# Patient Record
Sex: Female | Born: 1940 | Race: White | Hispanic: No | Smoking: Never smoker
Health system: Southern US, Community
[De-identification: ages and names within clinical notes are randomized; demographics above are authoritative.]

## PROBLEM LIST (undated history)

## (undated) DIAGNOSIS — I639 Cerebral infarction, unspecified: Secondary | ICD-10-CM

## (undated) DIAGNOSIS — I491 Atrial premature depolarization: Secondary | ICD-10-CM

## (undated) DIAGNOSIS — Z8781 Personal history of (healed) traumatic fracture: Secondary | ICD-10-CM

## (undated) DIAGNOSIS — C439 Malignant melanoma of skin, unspecified: Secondary | ICD-10-CM

## (undated) DIAGNOSIS — I4891 Unspecified atrial fibrillation: Secondary | ICD-10-CM

## (undated) DIAGNOSIS — R002 Palpitations: Secondary | ICD-10-CM

## (undated) DIAGNOSIS — R42 Dizziness and giddiness: Secondary | ICD-10-CM

## (undated) HISTORY — DX: Unspecified atrial fibrillation: I48.91

## (undated) HISTORY — DX: Cerebral infarction, unspecified: I63.9

## (undated) HISTORY — DX: Malignant melanoma of skin, unspecified: C43.9

## (undated) HISTORY — DX: Personal history of (healed) traumatic fracture: Z87.81

---

## 2017-02-26 ENCOUNTER — Ambulatory Visit (INDEPENDENT_AMBULATORY_CARE_PROVIDER_SITE_OTHER): Payer: Medicare HMO | Admitting: Neurology

## 2017-02-26 ENCOUNTER — Encounter: Payer: Self-pay | Admitting: Neurology

## 2017-02-26 ENCOUNTER — Encounter (INDEPENDENT_AMBULATORY_CARE_PROVIDER_SITE_OTHER): Payer: Self-pay

## 2017-02-26 DIAGNOSIS — I639 Cerebral infarction, unspecified: Secondary | ICD-10-CM | POA: Insufficient documentation

## 2017-02-26 DIAGNOSIS — I63132 Cerebral infarction due to embolism of left carotid artery: Secondary | ICD-10-CM | POA: Diagnosis not present

## 2017-02-26 NOTE — Progress Notes (Signed)
Reason for visit: Stroke  Referring physician: [name] is a 76 y.o. female  History of present illness:  Vicki Gray is a 76 year old right-handed white female with a history of onset of right arm pain discomfort and right hemisensory deficit that began around 01/25/2017. The patient also complained of severe dizziness around this time. The patient went to the hospital, she underwent a CT scan of the brain that was unremarkable and a MRI of the brain was done and revealed evidence of a left frontal acute stroke. According to the patient, she was sent home without any further workup. She returns to the hospital around 02/03/2017 with onset of worsening dizziness, she had a sensation of pounding in the heart and elevated blood pressure. Systolic blood pressures were greater than 200. The patient was felt to have panic attacks, but she was eventually admitted to the hospital and underwent further workup that included a 2-D echocardiogram with an ejection fraction of 55-60% with mild left ventricular concentric hypertrophy. 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 The patient had carotid Doppler studies that showed less than 50% stenosis of the carotid arteries bilaterally. The patient was then discharged on aspirin and Plavix. The patient began having episodes of generalized weakness, inability to walk, she was using a walker for ambulation. She had a sensation of numbness in the back of the head. The patient was told by her primary care physician that she was having panic attacks. She eventually went to a primary care physician in the [place], [place] area, she was found to have atrial fibrillation. She was sent to a cardiologist and had a stress test last week. The patient has been placed on Eliquis. The patient was taken off of Norvasc and was placed on metoprolol. Within  the last 2 days, her strength has improved, she is walking more normally. She is sent to this office for an evaluation.   Past Medical History:  Diagnosis Date  . A-fib ([place])   . Hx of fracture of hip    Repaired hip, knee fracture (repaired), right shoulder fracture, left wrist fracture  . Melanoma ([place])   . Stroke (cerebrum) ([place])    Left frontal  01/25/17    History reviewed. No pertinent surgical history.  Family History  Problem Relation Age of Onset  . Cancer Father   . Heart attack Brother   . Heart attack Brother     Social history:  reports that she has never smoked. She has never used smokeless tobacco. She reports that she does not drink alcohol or use drugs.  Medications:  Prior to Admission medications   Medication Sig Start Date End Date Taking? Authorizing Provider  apixaban (ELIQUIS) 5 MG TABS tablet Take 5 mg by mouth 2 (two) times daily.   Yes [provider]  Ascorbic Acid (VITA-C PO) Take 1 Dose by mouth daily.   Yes [provider]  Cholecalciferol (VITAMIN D PO) Take 1,000 Units by mouth daily.   Yes [provider]  Coenzyme Q10 (CO Q 10 PO) Take 1 Dose by mouth daily.   Yes [provider]  FOLIC ACID-VIT O2-HUT M54 PO Take 1 Dose by mouth daily.   Yes [provider]  metoprolol succinate (TOPROL-XL) 25 MG 24 hr tablet Take 25 mg by mouth daily.   Yes [provider]  Omega-3 Fatty Acids (FISH OIL  PO) Take 1 Dose by mouth daily.   Yes [provider]  TURMERIC PO Take 1 Dose by mouth daily.   Yes [provider]  UNABLE TO FIND Take 1 Dose by mouth daily. Med Name: Pure oil, OTC   Yes [provider]      Allergies  Allergen Reactions  . Ibuprofen Anaphylaxis and Rash  . Doxycycline     Gets very sick from medication  . Oxycodone Rash    ROS:  Out of a complete 14 system review of symptoms, the patient complains only of the following symptoms, and all other reviewed  systems are negative.  Palpitations of the heart Hearing loss Blurred vision urination problems Feeling hot Achy muscles Headache, weakness, dizziness Insomnia   Blood pressure 117/65, pulse (!) 54, height 5\' 5"  (1.651 m), weight 136 lb (61.7 kg).  Physical Exam  General: The patient is alert and cooperative at the time of the examination.  Eyes: Pupils are equal, round, and reactive to light. Discs are flat bilaterally. Good venous pulsations were seen bilaterally.  Neck: The neck is supple, no carotid bruits are noted.  Respiratory: The respiratory examination is clear.  Cardiovascular: The cardiovascular examination reveals a regular rate and rhythm, no obvious murmurs or rubs are noted.  Skin: Extremities are without significant edema.  Neurologic Exam  Mental status: The patient is alert and oriented x 3 at the time of the examination. The patient has apparent normal recent and remote memory, with an apparently normal attention span and concentration ability.  Cranial nerves: Facial symmetry is present. There is good sensation of the face to pinprick and soft touch bilaterally. The strength of the facial muscles and the muscles to head turning and shoulder shrug are normal bilaterally. Speech is well enunciated, no aphasia or dysarthria is noted. Extraocular movements are full. Visual fields are full. The tongue is midline, and the patient has symmetric elevation of the soft palate. No obvious hearing deficits are noted.  Motor: The motor testing reveals 5 over 5 strength of all 4 extremities. Good symmetric motor tone is noted throughout.  Sensory: Sensory testing is intact to pinprick, soft touch, vibration sensation, and position sense on all 4 extremities. No evidence of extinction is noted.  Coordination: Cerebellar testing reveals good finger-nose-finger and heel-to-shin bilaterally.  Gait and station: Gait is normal. Tandem gait is normal. Romberg is negative. No  drift is seen.  Reflexes: Deep tendon reflexes are symmetric and normal bilaterally. Toes are downgoing bilaterally.   Assessment/Plan:  1. History of left frontal stroke   2. Atrial fibrillation   The sensation of generalized weakness likely represented onset of atrial fibrillation with decreased ability to perform physical tasks. The clinical examination today is completely normal. The patient is now followed through cardiology, metoprolol has helped to control the heart rate and she is on Eliquis is a blood thinner. I see no need for aspirin therapy at this time. The patient will follow-up through this office on an as-needed basis.   Jill Alexanders MD 02/26/2017 2:40 PM  [facility] [address] [place], [place] [address]  Phone [phone] Fax [phone]

## 2017-03-16 DIAGNOSIS — I4891 Unspecified atrial fibrillation: Secondary | ICD-10-CM | POA: Diagnosis not present

## 2017-03-16 DIAGNOSIS — R002 Palpitations: Secondary | ICD-10-CM | POA: Diagnosis not present

## 2017-03-17 DIAGNOSIS — I440 Atrioventricular block, first degree: Secondary | ICD-10-CM | POA: Diagnosis not present

## 2017-03-17 DIAGNOSIS — Z87891 Personal history of nicotine dependence: Secondary | ICD-10-CM | POA: Diagnosis not present

## 2017-03-17 DIAGNOSIS — I10 Essential (primary) hypertension: Secondary | ICD-10-CM | POA: Diagnosis not present

## 2017-03-17 DIAGNOSIS — I495 Sick sinus syndrome: Secondary | ICD-10-CM | POA: Diagnosis not present

## 2017-03-17 DIAGNOSIS — Z8673 Personal history of transient ischemic attack (TIA), and cerebral infarction without residual deficits: Secondary | ICD-10-CM | POA: Diagnosis not present

## 2017-03-17 DIAGNOSIS — I4891 Unspecified atrial fibrillation: Secondary | ICD-10-CM | POA: Diagnosis not present

## 2017-03-17 DIAGNOSIS — I491 Atrial premature depolarization: Secondary | ICD-10-CM | POA: Diagnosis not present

## 2017-03-23 DIAGNOSIS — R002 Palpitations: Secondary | ICD-10-CM | POA: Diagnosis not present

## 2017-03-24 DIAGNOSIS — Z6824 Body mass index (BMI) 24.0-24.9, adult: Secondary | ICD-10-CM | POA: Diagnosis not present

## 2017-03-24 DIAGNOSIS — R0789 Other chest pain: Secondary | ICD-10-CM | POA: Diagnosis not present

## 2017-03-24 DIAGNOSIS — N951 Menopausal and female climacteric states: Secondary | ICD-10-CM | POA: Diagnosis not present

## 2017-03-24 DIAGNOSIS — I639 Cerebral infarction, unspecified: Secondary | ICD-10-CM | POA: Diagnosis not present

## 2017-03-24 DIAGNOSIS — I10 Essential (primary) hypertension: Secondary | ICD-10-CM | POA: Diagnosis not present

## 2017-03-25 DIAGNOSIS — E7800 Pure hypercholesterolemia, unspecified: Secondary | ICD-10-CM | POA: Diagnosis not present

## 2017-03-25 DIAGNOSIS — Z8673 Personal history of transient ischemic attack (TIA), and cerebral infarction without residual deficits: Secondary | ICD-10-CM | POA: Diagnosis not present

## 2017-03-25 DIAGNOSIS — I10 Essential (primary) hypertension: Secondary | ICD-10-CM | POA: Diagnosis not present

## 2017-03-25 DIAGNOSIS — Z7901 Long term (current) use of anticoagulants: Secondary | ICD-10-CM | POA: Diagnosis not present

## 2017-03-25 DIAGNOSIS — R079 Chest pain, unspecified: Secondary | ICD-10-CM | POA: Diagnosis not present

## 2017-03-25 DIAGNOSIS — E876 Hypokalemia: Secondary | ICD-10-CM | POA: Diagnosis not present

## 2017-03-25 DIAGNOSIS — Z79899 Other long term (current) drug therapy: Secondary | ICD-10-CM | POA: Diagnosis not present

## 2017-03-25 DIAGNOSIS — R001 Bradycardia, unspecified: Secondary | ICD-10-CM | POA: Diagnosis not present

## 2017-03-25 DIAGNOSIS — R002 Palpitations: Secondary | ICD-10-CM | POA: Diagnosis not present

## 2017-03-25 DIAGNOSIS — I4891 Unspecified atrial fibrillation: Secondary | ICD-10-CM | POA: Diagnosis not present

## 2017-03-28 ENCOUNTER — Emergency Department (HOSPITAL_COMMUNITY)
Admission: EM | Admit: 2017-03-28 | Discharge: 2017-03-28 | Disposition: A | Discharge status: Home / Self Care | Payer: Medicare HMO | Attending: Emergency Medicine | Admitting: Emergency Medicine

## 2017-03-28 ENCOUNTER — Emergency Department (HOSPITAL_COMMUNITY): Payer: Medicare HMO

## 2017-03-28 ENCOUNTER — Encounter (HOSPITAL_COMMUNITY): Payer: Self-pay | Admitting: Emergency Medicine

## 2017-03-28 DIAGNOSIS — R531 Weakness: Secondary | ICD-10-CM

## 2017-03-28 DIAGNOSIS — I4891 Unspecified atrial fibrillation: Secondary | ICD-10-CM

## 2017-03-28 DIAGNOSIS — Z8673 Personal history of transient ischemic attack (TIA), and cerebral infarction without residual deficits: Secondary | ICD-10-CM | POA: Diagnosis not present

## 2017-03-28 DIAGNOSIS — I959 Hypotension, unspecified: Secondary | ICD-10-CM | POA: Diagnosis not present

## 2017-03-28 DIAGNOSIS — R42 Dizziness and giddiness: Secondary | ICD-10-CM | POA: Diagnosis present

## 2017-03-28 DIAGNOSIS — Z7901 Long term (current) use of anticoagulants: Secondary | ICD-10-CM | POA: Diagnosis not present

## 2017-03-28 DIAGNOSIS — Z79899 Other long term (current) drug therapy: Secondary | ICD-10-CM | POA: Insufficient documentation

## 2017-03-28 DIAGNOSIS — E860 Dehydration: Secondary | ICD-10-CM

## 2017-03-28 DIAGNOSIS — Z85820 Personal history of malignant melanoma of skin: Secondary | ICD-10-CM | POA: Insufficient documentation

## 2017-03-28 LAB — BASIC METABOLIC PANEL
ANION GAP: 12 (ref 5–15)
BUN: 27 mg/dL — ABNORMAL HIGH (ref 6–20)
CO2: 26 mmol/L (ref 22–32)
Calcium: 9.2 mg/dL (ref 8.9–10.3)
Chloride: 90 mmol/L — ABNORMAL LOW (ref 101–111)
Creatinine, Ser: 1 mg/dL (ref 0.44–1.00)
GFR calc Af Amer: >60 mL/min (ref >60)
GFR calc non Af Amer: 53 mL/min — ABNORMAL LOW (ref >60)
Glucose, Bld: 154 mg/dL — ABNORMAL HIGH (ref 65–99)
POTASSIUM: 3.1 mmol/L — AB (ref 3.5–5.1)
Sodium: 128 mmol/L — ABNORMAL LOW (ref 135–145)

## 2017-03-28 LAB — URINALYSIS, ROUTINE W REFLEX MICROSCOPIC
BILIRUBIN URINE: NEGATIVE
Glucose, UA: NEGATIVE mg/dL
Hgb urine dipstick: NEGATIVE
Ketones, ur: 5 mg/dL — AB
Leukocytes, UA: NEGATIVE
NITRITE: NEGATIVE
PROTEIN: NEGATIVE mg/dL
SPECIFIC GRAVITY, URINE: 1.005 (ref 1.005–1.030)
pH: 6 (ref 5.0–8.0)

## 2017-03-28 LAB — CBC
HEMATOCRIT: 45.3 % (ref 36.0–46.0)
HEMOGLOBIN: 16.1 g/dL — AB (ref 12.0–15.0)
MCH: 30.1 pg (ref 26.0–34.0)
MCHC: 35.5 g/dL (ref 30.0–36.0)
MCV: 84.8 fL (ref 78.0–100.0)
Platelets: 182 10*3/uL (ref 150–400)
RBC: 5.34 MIL/uL — ABNORMAL HIGH (ref 3.87–5.11)
RDW: 12.8 % (ref 11.5–15.5)
WBC: 7.2 10*3/uL (ref 4.0–10.5)

## 2017-03-28 LAB — I-STAT TROPONIN, ED: Troponin i, poc: 0 ng/mL (ref 0.00–0.08)

## 2017-03-28 LAB — MAGNESIUM: Magnesium: 1.9 mg/dL (ref 1.7–2.4)

## 2017-03-28 LAB — TSH: TSH: 0.907 u[IU]/mL (ref 0.350–4.500)

## 2017-03-28 MED ORDER — SODIUM CHLORIDE 0.9 % IV BOLUS (SEPSIS)
1000.0000 mL | Freq: Once | INTRAVENOUS | Status: AC
  Administered 2017-03-28: 1000 mL via INTRAVENOUS

## 2017-03-28 MED ORDER — POTASSIUM CHLORIDE CRYS ER 20 MEQ PO TBCR
40.0000 meq | EXTENDED_RELEASE_TABLET | Freq: Once | ORAL | Status: AC
  Administered 2017-03-28: 40 meq via ORAL
  Filled 2017-03-28: qty 2

## 2017-03-28 NOTE — ED Provider Notes (Signed)
[facility] DEPT Provider Note   CSN: [id number] Arrival date & time: 03/28/17  1101     History   Chief Complaint Chief Complaint  Patient presents with  . Hypotension    HPI Vicki Gray is a 76 y.o. female.  HPI 76 year old female who presents with hypotension just prior to arrival. She has a history of a prior left frontal CVA, and hypertension. She was empirically started on a blood thinner and beta blocker for concern for A. fib, as she has had recurrent dizziness and palpitations in the past. It has not been documented by EKG or cardiac monitoring in the past. She reports that last night her blood pressure did spike to 833 systolic and she took a when necessary dose of clonidine. This morning, states that when she was walking to the bathroom she felt very weak and dizzy as if she could pass out. She checked her blood pressure to times and both times she had a systolic blood pressure of 90. Denies any chest pain, lower extremity edema, orthopnea or PND. Denies any shortness of breath currently, but did feel a little short of breath during her near syncopal episode. Denies any fevers, nausea or vomiting, diarrhea, melena or hematochezia. Denies any abdominal pain or back pain.  She denies any recent changes to her medications. She is wearing a loop recorder per her cardiologist to see if her palpitations are truly due to A. fib. Past Medical History:  Diagnosis Date  . A-fib ([place])   . Hx of fracture of hip    Repaired hip, knee fracture (repaired), right shoulder fracture, left wrist fracture  . Melanoma ([place])   . Stroke (cerebrum) ([place])    Left frontal  01/25/17    Patient Active Problem List   Diagnosis Date Noted  . Stroke (cerebrum) ([place]) 02/26/2017    History reviewed. No pertinent surgical history.  OB History    No data available       Home Medications    Prior to Admission medications   Medication Sig Start Date End Date Taking? Authorizing Provider    apixaban (ELIQUIS) 5 MG TABS tablet Take 5 mg by mouth 2 (two) times daily.   Yes [provider]  cloNIDine (CATAPRES) 0.1 MG tablet Take 0.1 mg by mouth at bedtime.   Yes [provider]  hydrochlorothiazide (MICROZIDE) 12.5 MG capsule Take 12.5 mg by mouth daily.   Yes [provider]  HYDROcodone-acetaminophen (NORCO/VICODIN) 5-325 MG tablet Take 1 tablet by mouth every 6 (six) hours as needed for moderate pain.   Yes [provider]  losartan (COZAAR) 25 MG tablet Take 25 mg by mouth daily.   Yes [provider]  metoprolol succinate (TOPROL-XL) 50 MG 24 hr tablet Take 50 mg by mouth daily.    Yes [provider]  nitroGLYCERIN (NITROSTAT) 0.4 MG SL tablet Place 0.4 mg under the tongue every 5 (five) minutes as needed for chest pain.   Yes [provider]  UNABLE TO FIND Take 1 Dose by mouth daily. Med Name: Pure oil, OTC   Yes [provider]  Ascorbic Acid (VITA-C PO) Take 1 Dose by mouth daily.    [provider]  Cholecalciferol (VITAMIN D PO) Take 1,000 Units by mouth daily.    [provider]  Coenzyme Q10 (CO Q 10 PO) Take 1 Dose by mouth daily.    [provider]  FOLIC ACID-VIT A2-NKN L97 PO Take 1 Dose by mouth daily.  [provider]  Omega-3 Fatty Acids (FISH OIL PO) Take 1 Dose by mouth daily.    [provider]  TURMERIC PO Take 1 Dose by mouth daily.    [provider]    Family History Family History  Problem Relation Age of Onset  . Cancer Father   . Heart attack Brother   . Heart attack Brother     Social History Social History  Substance Use Topics  . Smoking status: Never Smoker  . Smokeless tobacco: Never Used  . Alcohol use No     Allergies   Ibuprofen; Doxycycline; and Oxycodone   Review of Systems Review of Systems  Constitutional: Negative for fever.  Respiratory: Negative for cough and shortness of breath.    Cardiovascular: Negative for chest pain.  Gastrointestinal: Negative for abdominal pain and blood in stool.  Genitourinary: Negative for dysuria and frequency.  All other systems reviewed and are negative.    Physical Exam Updated Vital Signs BP 124/72 (BP Location: Left Arm)   Pulse 66   Temp 98.1 F (36.7 C) (Oral)   Resp 18   Ht 5\' 5"  (1.651 m)   Wt 61.2 kg (135 lb)   SpO2 98%   BMI 22.47 kg/m   Physical Exam Physical Exam  Nursing note and vitals reviewed. Constitutional: Well developed, well nourished, appears weak and fatigued, non-toxic, and in no acute distress Head: Normocephalic and atraumatic.  Mouth/Throat: Oropharynx is clear and mildly dry mucous membranes.  Neck: Normal range of motion. Neck supple.  Cardiovascular: Normal rate and irregularly irregular rhythm.  no edema Pulmonary/Chest: Effort normal and breath sounds normal.  Abdominal: Soft. There is no tenderness. There is no rebound and no guarding.  Musculoskeletal: Normal range of motion.  Neurological: Alert, no facial droop, fluent speech, moves all extremities symmetrically Skin: Skin is warm and dry.  Psychiatric: Cooperative   ED Treatments / Results  Labs (all labs ordered are listed, but only abnormal results are displayed) Labs Reviewed  BASIC METABOLIC PANEL - Abnormal; Notable for the following:       Result Value   Sodium 128 (*)    Potassium 3.1 (*)    Chloride 90 (*)    Glucose, Bld 154 (*)    BUN 27 (*)    GFR calc non Af Amer 53 (*)    All other components within normal limits  CBC - Abnormal; Notable for the following:    RBC 5.34 (*)    Hemoglobin 16.1 (*)    All other components within normal limits  URINALYSIS, ROUTINE W REFLEX MICROSCOPIC - Abnormal; Notable for the following:    Color, Urine STRAW (*)    Ketones, ur 5 (*)    All other components within normal limits  TSH  MAGNESIUM  I-STAT TROPONIN, ED    EKG  EKG Interpretation  Date/Time:  Sunday March 28 2017 11:17:18 EDT Ventricular Rate:  111 PR Interval:    QRS Duration: 108 QT Interval:  379 QTC Calculation: 472 R Axis:   123 Text Interpretation:  Atrial fibrillation Left posterior fascicular block no prior EKG new onset atrial fibrillation  Confirmed by Brantley Stage [phone]) on 03/28/2017 12:54:10 PM       Radiology Dg Chest 2 View  Result Date: 03/28/2017 CLINICAL DATA:  Hypotension, near syncope, history of atrial fibrillation, stroke, melanoma EXAM: CHEST  2 VIEW COMPARISON:  None FINDINGS: Reported heart monitor projects over LEFT lung. Normal heart size, mediastinal contours, and pulmonary vascularity.  Lungs minimally hyperinflated but clear. No pulmonary infiltrate, pleural effusion or pneumothorax. Bones demineralized. IMPRESSION: No acute abnormalities. Electronically Signed   By: Lavonia Cullen Lahaie M.D.   On: 03/28/2017 12:00    Procedures Procedures (including critical care time)  Medications Ordered in ED Medications  sodium chloride 0.9 % bolus 1,000 mL (0 mLs Intravenous Stopped 03/28/17 1305)  potassium chloride SA (K-DUR,KLOR-CON) CR tablet 40 mEq (40 mEq Oral Given 03/28/17 1305)     Initial Impression / Assessment and Plan / ED Course  I have reviewed the triage vital signs and the nursing notes.  Pertinent labs & imaging results that were available during my care of the patient were reviewed by me and considered in my medical decision making (see chart for details).     76 year old female who presents with hypotension and lightheadedness/dizziness this morning. She is nontoxic in no acute distress. She has been normotensive while here in the emergency department. She is orthostatic, and feels lightheaded when she stands up to walk. He is given IV fluids, and she feels significantly improved. She has been able to ambulate without difficulty throughout the ED and no longer feels weak or lightheaded. Her blood work is suggestive of mild dehydration. She has slight ketones in  the urine but without infection. There is mild hyponatremia, hypokalemia and hypochloremia. She did receive IV fluids, and potassium supplementation.  She does incidentally have atrial fibrillation. Rate has overall been well controlled with heart rate in the 60s to 70s. She is alert he received empiric treatment through her previous cardiologist with metoprolol and blood thinner. Appears that she's not had official diagnosis of A. fib that she has not had previous EKGs suggestive of this, and diagnosis is only presumed. She has been provided EKG to take to her cardiologist that she is wearing a loop monitor right now to see if they can catch incidences of A. Fib.  Likely a combination of her A. fib as well as mild dehydration that is causing her symptoms. She does significantly feels improved here, and as are the medications that is controlling her A. fib. I discussed close follow-up with her cardiologist for ongoing management. She also has follow-up with her PCP later this week for recheck. Strict return and follow-up instructions reviewed. She expressed understanding of all discharge instructions and felt comfortable with the plan of care.   Final Clinical Impressions(s) / ED Diagnoses   Final diagnoses:  New onset atrial fibrillation ([place])  Dehydration  Weakness    New Prescriptions New Prescriptions   No medications on file     Forde Dandy, MD 03/28/17 1419

## 2017-03-28 NOTE — ED Triage Notes (Signed)
Patient c/o hypotension. Per patient blood pressure 90/60. Patient states generalized weakness with dizziness. Patient reports that she is wearing a heart monitor and was called by staff at [facility] telling her to come to ER. Patient unsure of reason. Per patient is having intermittent chest pain with shortness of breath.

## 2017-03-28 NOTE — Discharge Instructions (Signed)
Please follow-up with your primary care doctor on Thursday as scheduled. Please also call your cardiologist office for close follow-up appointment. We did notice that you have atrial fibrillation today, but your rate is well controlled. You  need to continue taking her metoprolol and blood thinner as prescribed. We have given you a copy of your EKG to take to your cardiologist at [facility].  Please return without fail for worsening symptoms, including fever, confusion, passing out, chest pains, or any other symptoms that are concerning to you.

## 2017-03-28 NOTE — ED Notes (Signed)
Pt ambulated with only one assist needed. She usually uses a walker at home. There was no un-steadiness noted.

## 2017-03-28 NOTE — ED Triage Notes (Signed)
Patient's family states that 5 days ago patient showed low potassium through blood work and received potassium pills at [facility] in [place].

## 2017-03-30 DIAGNOSIS — I10 Essential (primary) hypertension: Secondary | ICD-10-CM | POA: Diagnosis not present

## 2017-04-01 DIAGNOSIS — R079 Chest pain, unspecified: Secondary | ICD-10-CM | POA: Diagnosis not present

## 2017-04-01 DIAGNOSIS — E782 Mixed hyperlipidemia: Secondary | ICD-10-CM | POA: Diagnosis not present

## 2017-04-01 DIAGNOSIS — I10 Essential (primary) hypertension: Secondary | ICD-10-CM | POA: Diagnosis not present

## 2017-04-01 DIAGNOSIS — F419 Anxiety disorder, unspecified: Secondary | ICD-10-CM | POA: Diagnosis not present

## 2017-04-01 DIAGNOSIS — Z0001 Encounter for general adult medical examination with abnormal findings: Secondary | ICD-10-CM | POA: Diagnosis not present

## 2017-04-01 DIAGNOSIS — I639 Cerebral infarction, unspecified: Secondary | ICD-10-CM | POA: Diagnosis not present

## 2017-04-01 DIAGNOSIS — Z6824 Body mass index (BMI) 24.0-24.9, adult: Secondary | ICD-10-CM | POA: Diagnosis not present

## 2017-04-01 DIAGNOSIS — N951 Menopausal and female climacteric states: Secondary | ICD-10-CM | POA: Diagnosis not present

## 2017-04-01 DIAGNOSIS — I482 Chronic atrial fibrillation: Secondary | ICD-10-CM | POA: Diagnosis not present

## 2017-04-04 ENCOUNTER — Emergency Department (HOSPITAL_COMMUNITY): Payer: Medicare HMO

## 2017-04-04 ENCOUNTER — Encounter (HOSPITAL_COMMUNITY): Payer: Self-pay | Admitting: Emergency Medicine

## 2017-04-04 ENCOUNTER — Emergency Department (HOSPITAL_COMMUNITY)
Admission: EM | Admit: 2017-04-04 | Discharge: 2017-04-04 | Disposition: A | Discharge status: Home / Self Care | Payer: Medicare HMO | Attending: Family Medicine | Admitting: Family Medicine

## 2017-04-04 DIAGNOSIS — R5383 Other fatigue: Secondary | ICD-10-CM | POA: Diagnosis not present

## 2017-04-04 DIAGNOSIS — R531 Weakness: Secondary | ICD-10-CM | POA: Diagnosis not present

## 2017-04-04 DIAGNOSIS — Z7901 Long term (current) use of anticoagulants: Secondary | ICD-10-CM | POA: Insufficient documentation

## 2017-04-04 DIAGNOSIS — I10 Essential (primary) hypertension: Secondary | ICD-10-CM | POA: Diagnosis present

## 2017-04-04 DIAGNOSIS — Z79899 Other long term (current) drug therapy: Secondary | ICD-10-CM | POA: Diagnosis not present

## 2017-04-04 DIAGNOSIS — M792 Neuralgia and neuritis, unspecified: Secondary | ICD-10-CM | POA: Diagnosis not present

## 2017-04-04 DIAGNOSIS — Z8673 Personal history of transient ischemic attack (TIA), and cerebral infarction without residual deficits: Secondary | ICD-10-CM | POA: Insufficient documentation

## 2017-04-04 DIAGNOSIS — E871 Hypo-osmolality and hyponatremia: Secondary | ICD-10-CM | POA: Insufficient documentation

## 2017-04-04 DIAGNOSIS — F419 Anxiety disorder, unspecified: Secondary | ICD-10-CM | POA: Diagnosis not present

## 2017-04-04 DIAGNOSIS — I639 Cerebral infarction, unspecified: Secondary | ICD-10-CM | POA: Diagnosis not present

## 2017-04-04 HISTORY — DX: Palpitations: R00.2

## 2017-04-04 HISTORY — DX: Dizziness and giddiness: R42

## 2017-04-04 HISTORY — DX: Atrial premature depolarization: I49.1

## 2017-04-04 LAB — CBC WITH DIFFERENTIAL/PLATELET
BASOS PCT: 1 %
Basophils Absolute: 0 10*3/uL (ref 0.0–0.1)
Eosinophils Absolute: 0.1 10*3/uL (ref 0.0–0.7)
Eosinophils Relative: 3 %
HEMATOCRIT: 39.2 % (ref 36.0–46.0)
Hemoglobin: 13.7 g/dL (ref 12.0–15.0)
Lymphocytes Relative: 30 %
Lymphs Abs: 1.5 10*3/uL (ref 0.7–4.0)
MCH: 29.9 pg (ref 26.0–34.0)
MCHC: 34.9 g/dL (ref 30.0–36.0)
MCV: 85.6 fL (ref 78.0–100.0)
MONO ABS: 0.4 10*3/uL (ref 0.1–1.0)
MONOS PCT: 8 %
NEUTROS ABS: 2.9 10*3/uL (ref 1.7–7.7)
Neutrophils Relative %: 58 %
Platelets: 183 10*3/uL (ref 150–400)
RBC: 4.58 MIL/uL (ref 3.87–5.11)
RDW: 12.8 % (ref 11.5–15.5)
WBC: 4.9 10*3/uL (ref 4.0–10.5)

## 2017-04-04 LAB — COMPREHENSIVE METABOLIC PANEL
ALK PHOS: 57 U/L (ref 38–126)
ALT: 16 U/L (ref 14–54)
AST: 19 U/L (ref 15–41)
Albumin: 3.8 g/dL (ref 3.5–5.0)
Anion gap: 9 (ref 5–15)
BUN: 16 mg/dL (ref 6–20)
CALCIUM: 9.3 mg/dL (ref 8.9–10.3)
CO2: 28 mmol/L (ref 22–32)
CREATININE: 0.83 mg/dL (ref 0.44–1.00)
Chloride: 91 mmol/L — ABNORMAL LOW (ref 101–111)
Glucose, Bld: 105 mg/dL — ABNORMAL HIGH (ref 65–99)
Potassium: 4.2 mmol/L (ref 3.5–5.1)
Sodium: 128 mmol/L — ABNORMAL LOW (ref 135–145)
Total Bilirubin: 0.6 mg/dL (ref 0.3–1.2)
Total Protein: 7.1 g/dL (ref 6.5–8.1)

## 2017-04-04 LAB — MAGNESIUM: Magnesium: 2.2 mg/dL (ref 1.7–2.4)

## 2017-04-04 LAB — LACTIC ACID, PLASMA
LACTIC ACID, VENOUS: 1.4 mmol/L (ref 0.5–1.9)
Lactic Acid, Venous: 1 mmol/L (ref 0.5–1.9)

## 2017-04-04 LAB — URINALYSIS, ROUTINE W REFLEX MICROSCOPIC
Bilirubin Urine: NEGATIVE
GLUCOSE, UA: NEGATIVE mg/dL
HGB URINE DIPSTICK: NEGATIVE
Ketones, ur: NEGATIVE mg/dL
Leukocytes, UA: NEGATIVE
Nitrite: NEGATIVE
Protein, ur: NEGATIVE mg/dL
SPECIFIC GRAVITY, URINE: 1.004 — AB (ref 1.005–1.030)
pH: 7 (ref 5.0–8.0)

## 2017-04-04 LAB — TROPONIN I

## 2017-04-04 MED ORDER — ALPRAZOLAM 0.5 MG PO TABS
0.5000 mg | ORAL_TABLET | Freq: Once | ORAL | Status: AC
  Administered 2017-04-04: 0.5 mg via ORAL
  Filled 2017-04-04: qty 1

## 2017-04-04 MED ORDER — ALPRAZOLAM 0.5 MG PO TABS: 0.5000 mg | ORAL_TABLET | Freq: Every evening | ORAL | 0 refills | Status: AC | PRN

## 2017-04-04 MED ORDER — ACETAMINOPHEN 325 MG PO TABS
650.0000 mg | ORAL_TABLET | Freq: Once | ORAL | Status: AC
  Administered 2017-04-04: 650 mg via ORAL
  Filled 2017-04-04: qty 2

## 2017-04-04 MED ORDER — SODIUM CHLORIDE 0.9 % IV SOLN
INTRAVENOUS | Status: DC
  Administered 2017-04-04: 20:00:00 via INTRAVENOUS

## 2017-04-04 NOTE — Consult Note (Addendum)
[place] Consult    Patient Demographics:    Vicki Gray, is a 76 y.o. female  MRN: [id number]  DOB - Dec 16, 1940     Chief Complaint  Patient presents with  . Hypertension      HPI:    Vicki Gray  is a 76 y.o. female, With history of left frontal CVA, hypertension, recent diagnosis of atrial fibrillation currently on anticoagulation with eliquis and metoprolol came to hospital with generalized weakness, episodes of feeling hot and cold, sharp shooting pains through her body. Patient says that she did not any of these symptoms before July 13. She was recently started on multiple medications after diagnosis of TIA versus stroke at [facility].  Patient was taking hydrochlorothiazide 12.5 mg daily which was discontinued 3 days ago by her PCP. She also takes clonidine 0.1 mg daily when necessary for high blood pressure. Patient has been checking her blood pressure 4-5 times a day at home. She also complains of dizziness on standing, but dry mouth, dysuria. Her UA in the ED is negative.  Patient says that she has not been able to sleep well over past few days, as she is always concerned with high blood pressure and fatigue with generalized weakness.  She denies nausea vomiting or diarrhea. No chest shortness of breath. No coughing up any phlegm.   Review of systems:      All other systems reviewed and are negative.   With Past History of the following :    Past Medical History:  Diagnosis Date  . A-fib ([place])   . Dizziness   . Hx of fracture of hip    Repaired hip, knee fracture (repaired), right shoulder fracture, left wrist fracture  . Melanoma ([place])   . PAC (premature atrial contraction)   . Palpitations   . Stroke (cerebrum) ([place])    Left frontal  01/25/17      History reviewed. No pertinent surgical history.    Social History:      Social History  Substance Use Topics  . Smoking  status: Never Smoker  . Smokeless tobacco: Never Used  . Alcohol use No       Family History :     Family History  Problem Relation Age of Onset  . Cancer Father   . Heart attack Brother   . Heart attack Brother       Home Medications:   Prior to Admission medications   Medication Sig Start Date End Date Taking? Authorizing Provider  acetaminophen-codeine (TYLENOL #3) 300-30 MG tablet Take 1 tablet by mouth every 6 (six) hours as needed for moderate pain.   Yes [provider]  apixaban (ELIQUIS) 5 MG TABS tablet Take 5 mg by mouth 2 (two) times daily.   Yes [provider]  Ascorbic Acid (VITA-C PO) Take 1 Dose by mouth daily.   Yes [provider]  Cholecalciferol (VITAMIN D PO) Take 1,000 Units by mouth daily.   Yes [provider]  cloNIDine (CATAPRES) 0.1 MG tablet Take 0.1 mg by mouth at  bedtime.   Yes [provider]  Coenzyme Q10 (CO Q 10 PO) Take 1 Dose by mouth daily.   Yes [provider]  FOLIC ACID-VIT K5-LZJ Q73 PO Take 1 Dose by mouth daily.   Yes [provider]  HYDROcodone-acetaminophen (NORCO/VICODIN) 5-325 MG tablet Take 0.5-1 tablets by mouth every 6 (six) hours as needed for moderate pain.    Yes [provider]  losartan (COZAAR) 50 MG tablet Take 50 mg by mouth daily.    Yes [provider]  metoprolol succinate (TOPROL-XL) 50 MG 24 hr tablet Take 25 mg by mouth 2 (two) times daily.    Yes [provider]  nitroGLYCERIN (NITROSTAT) 0.4 MG SL tablet Place 0.4 mg under the tongue every 5 (five) minutes as needed for chest pain.   Yes [provider]  sertraline (ZOLOFT) 25 MG tablet Take 25 mg by mouth daily.   Yes [provider]  UNABLE TO FIND Take 1 Dose by mouth daily. Med Name: Pure oil, OTC   Yes [provider]  UNABLE TO FIND Take 1-2 drops by mouth daily as needed. Med Name: CBD Oil   Yes [provider]  Omega-3 Fatty Acids  (FISH OIL PO) Take 1 Dose by mouth daily.    [provider]  TURMERIC PO Take 1 Dose by mouth daily.    [provider]     Allergies:     Allergies  Allergen Reactions  . Ibuprofen Anaphylaxis and Rash  . Lisinopril Shortness Of Breath and Swelling  . Doxycycline     Gets very sick from medication  . Oxycodone Rash     Physical Exam:   Vitals  Blood pressure (!) 177/78, pulse (!) 53, temperature 97.7 F (36.5 C), temperature source Oral, resp. rate 18, height 5\' 5"  (1.651 m), weight 59 kg (130 lb), SpO2 100 %.  1.  General: Appears in no acute distress, lethargic  2. Psychiatric:  Intact judgement and  insight, awake alert, oriented x 3.  3. Neurologic: No focal neurological deficits, all cranial nerves intact.Strength 5/5 all 4 extremities, sensation intact all 4 extremities, plantars down going.  4. Eyes :  anicteric sclerae, moist conjunctivae with no lid lag. PERRLA.  5. ENMT:  Oropharynx clear with moist mucous membranes and good dentition  6. Neck:  supple, no cervical lymphadenopathy appriciated, No thyromegaly  7. Respiratory : Normal respiratory effort, good air movement bilaterally,clear to  auscultation bilaterally  8. Cardiovascular : RRR, no gallops, rubs or murmurs, no leg edema  9. Gastrointestinal:  Positive bowel sounds, abdomen soft, non-tender to palpation,no hepatosplenomegaly, no rigidity or guarding       10. Skin:  No cyanosis, normal texture and turgor, no rash, lesions or ulcers  11.Musculoskeletal:  Good muscle tone,  joints appear normal , no effusions,  normal range of motion    Data Review:    CBC  Recent Labs Lab 04/04/17 1807  WBC 4.9  HGB 13.7  HCT 39.2  PLT 183  MCV 85.6  MCH 29.9  MCHC 34.9  RDW 12.8  LYMPHSABS 1.5  MONOABS 0.4  EOSABS 0.1  BASOSABS 0.0   ------------------------------------------------------------------------------------------------------------------  Chemistries    Recent Labs Lab 04/04/17 1807  NA 128*  K 4.2  CL 91*  CO2 28  GLUCOSE 105*  BUN 16  CREATININE 0.83  CALCIUM 9.3  MG 2.2  AST 19  ALT 16  ALKPHOS 57  BILITOT 0.6   ------------------------------------------------------------------------------------------------------------------  ------------------------------------------------------------------------------------------------------------------ GFR:  Estimated Creatinine Clearance: 51.9 mL/min (by C-G formula based on SCr of 0.83 mg/dL). Liver Function Tests:  Recent Labs Lab 04/04/17 1807  AST 19  ALT 16  ALKPHOS 57  BILITOT 0.6  PROT 7.1  ALBUMIN 3.8   No results for input(s): LIPASE, AMYLASE in the last 168 hours. No results for input(s): AMMONIA in the last 168 hours. Coagulation Profile: No results for input(s): INR, PROTIME in the last 168 hours. Cardiac Enzymes:  Recent Labs Lab 04/04/17 1807  TROPONINI <0.03   BNP (last 3 results) No results for input(s): PROBNP in the last 8760 hours. HbA1C: No results for input(s): HGBA1C in the last 72 hours. CBG: No results for input(s): GLUCAP in the last 168 hours. Lipid Profile: No results for input(s): CHOL, HDL, LDLCALC, TRIG, CHOLHDL, LDLDIRECT in the last 72 hours. Thyroid Function Tests: No results for input(s): TSH, T4TOTAL, FREET4, T3FREE, THYROIDAB in the last 72 hours. Anemia Panel: No results for input(s): VITAMINB12, FOLATE, FERRITIN, TIBC, IRON, RETICCTPCT in the last 72 hours.  --------------------------------------------------------------------------------------------------------------- Urine analysis:    Component Value Date/Time   COLORURINE STRAW (A) 04/04/2017 1720   APPEARANCEUR CLEAR 04/04/2017 1720   LABSPEC 1.004 (L) 04/04/2017 1720   PHURINE 7.0 04/04/2017 1720   GLUCOSEU NEGATIVE 04/04/2017 1720   HGBUR NEGATIVE 04/04/2017 1720   BILIRUBINUR NEGATIVE 04/04/2017 1720   KETONESUR NEGATIVE 04/04/2017 1720   PROTEINUR NEGATIVE  04/04/2017 1720   NITRITE NEGATIVE 04/04/2017 1720   LEUKOCYTESUR NEGATIVE 04/04/2017 1720      Imaging Results:    Dg Chest 2 View  Result Date: 04/04/2017 CLINICAL DATA:  Weakness.  Burning sensation. EXAM: CHEST  2 VIEW COMPARISON:  March 28, 2017 FINDINGS: The heart monitor projects over the left lung. No pneumothorax. The cardiomediastinal silhouette is normal. No pulmonary nodules, masses, or focal infiltrates. IMPRESSION: No active cardiopulmonary disease. Electronically Signed   By: Dorise Bullion III M.D   On: 04/04/2017 18:15   Ct Head Wo Contrast  Result Date: 04/04/2017 CLINICAL DATA:  Pt reports she has been having a burning sensation "internally all over" for two months since her stroke with fluctuating BP. Also states vaginal burning with scant urination. Denies burning with urination or blood in urine. Pt stage she has been eating and drinking normally. EXAM: CT HEAD WITHOUT CONTRAST TECHNIQUE: Contiguous axial images were obtained from the base of the skull through the vertex without intravenous contrast. COMPARISON:  CT head 02/01/2017.  Intracranial MRA 02/02/2017. FINDINGS: Brain: There is no evidence of acute intracranial hemorrhage, mass lesion, brain edema or extra-axial fluid collection. The ventricles and subarachnoid spaces are appropriately sized for age. There is no CT evidence of acute cortical infarction. Vascular: Mild hyperdensity in the region of the right cavernous ICA, similar to previous studies. Skull: Negative for fracture or focal lesion. Sinuses/Orbits: Circumferential right maxillary sinus mucosal thickening, stable. The visualized paranasal sinuses and mastoid air cells are otherwise clear. No orbital abnormalities are seen. Other: None. IMPRESSION: 1. Stable examination.  No acute intracranial findings. 2. Stable right maxillary sinus mucosal thickening. Electronically Signed   By: Richardean Sale M.D.   On: 04/04/2017 18:02    My personal review of EKG:  Rhythm NSR   Assessment & Plan:   Fatigue Hyponatremia Anxiety Hypertension   1. Fatigue/generalized weakness- I feel most of her symptoms are due to polypharmacy, patient was started on multiple medications 2 weeks ago and has started having these symptoms. HCTZ has already been discontinued by her PCP, I  will also recommended her not to use clonidine more frequently. Patient is also on Toprol-XL which can cause fatigue. Patient takes magnesium supplementation at home I have encouraged her to take magnesium oxide 250-500 mg twice a day. I feel that after stopping HCTZ her fatigue and generalized weakness should improve over the next few days. 2. Hyponatremia-patient's sodium is 128, likely from HCTZ which has been discontinued. Would recommend checking BMP in 2 weeks as outpatient. 3. Atrial fibrillation-heart rate is controlled, continue anticoagulation with eliquis twice a day, Toprol-XL 50 mg by mouth daily 4. Generalized pain- patient has hydrocodone at home. I recommended her to continue taking hydrocodone on a when necessary basis 5. Anxiety/insomnia-patient seems very anxious regarding her blood pressure has been checking her blood pressure 4-5 times a day. I have recommended her not to check it more than twice a day. Also would recommend giving her Xanax 0.5 mg by mouth daily at bedtime when necessary for anxiety/insomnia. 6. Hypertension- patient takes Toprol-XL 50 mg daily, Cozaar dose was recently increased to 50 mg by her PCP. Also takes Catapres 0.1 mg on a when necessary basis. I would not add any more medications and have recommended to continue checking her blood pressure twice a day and follow-up with PCP in next 3-4 days.  Patient can be discharged home on Xanax 0.5 mg by mouth daily at bedtime when necessary,   [facility] S M.D on 04/04/2017 at 8:39 PM  Between 7am to 7pm - Pager - [phone]. After 7pm go to [email or web address] - password [facility]  [facility] - Office   [phone]

## 2017-04-04 NOTE — ED Notes (Signed)
Pt's family member called out at this time stating pt is having a stroke. Upon assessing pt she states she had 3 "sharp shooting pulses" in her L foot, this feeling is the same as when she had a stroke 2 months ago only then it was in her arm. Neuro is intact, A & O X4, no obvious deficits noted. MD Thurnell Garbe to bedside to examine pt.

## 2017-04-04 NOTE — ED Triage Notes (Signed)
Pt reports she has been having a burning sensation "internally all over" for two months since her stroke with fluctuating BP. Also states vaginal burning with scant urination. Denies burning with urination or blood in urine. Pt states she has been eating and drinking like normal.

## 2017-04-04 NOTE — ED Provider Notes (Signed)
[facility] DEPT Provider Note   CSN: [id number] Arrival date & time: 04/04/17  1553     History   Chief Complaint Chief Complaint  Patient presents with  . Hypertension    HPI Vicki Gray is a 76 y.o. female.  HPI  Pt was seen at 1715. Per pt and her family, c/o gradual onset and persistence of constant "burning sensation internally all over" for the past 2 months. Pt states her symptoms began after she had a stroke. Pt's family also states pt's "BP has been fluctuating," and "she's still having the same symptoms like when she was here a week ago" (generalized weakness, fatigue).  Pt also states she had 3 very brief sharp shooting pulses in her left foot today (fully resolved currently). Pt states she had similar pain in her right arm "when I had my stroke" and she is concerned regarding same today. Pt states she has been evaluated by her PMD for her symptoms and was told "it was anxiety." Pt states she was told in the ED last week "I was dehydrated and my potassium was low." Denies CP/palpitations, no SOB/cough, no abd pain, no N/V/D, no focal motor weakness, no tingling/numbness in extremities, no fevers, no neck or back pain.   Past Medical History:  Diagnosis Date  . A-fib ([place])   . Dizziness   . Hx of fracture of hip    Repaired hip, knee fracture (repaired), right shoulder fracture, left wrist fracture  . Melanoma ([place])   . PAC (premature atrial contraction)   . Palpitations   . 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 Stroke (cerebrum) ([place]) 02/26/2017    History reviewed. No pertinent surgical history.  OB History    No data available       Home Medications    Prior to Admission medications   Medication Sig Start Date End Date Taking? Authorizing Provider  apixaban (ELIQUIS) 5 MG TABS tablet Take 5 mg by mouth 2 (two) times daily.    [provider]  Ascorbic Acid (VITA-C PO) Take  1 Dose by mouth daily.    [provider]  Cholecalciferol (VITAMIN D PO) Take 1,000 Units by mouth daily.    [provider]  cloNIDine (CATAPRES) 0.1 MG tablet Take 0.1 mg by mouth at bedtime.    [provider]  Coenzyme Q10 (CO Q 10 PO) Take 1 Dose by mouth daily.    [provider]  FOLIC ACID-VIT Y4-IHK V42 PO Take 1 Dose by mouth daily.    [provider]  hydrochlorothiazide (MICROZIDE) 12.5 MG capsule Take 12.5 mg by mouth daily.    [provider]  HYDROcodone-acetaminophen (NORCO/VICODIN) 5-325 MG tablet Take 1 tablet by mouth every 6 (six) hours as needed for moderate pain.    [provider]  losartan (COZAAR) 25 MG tablet Take 25 mg by mouth daily.    [provider]  metoprolol succinate (TOPROL-XL) 50 MG 24 hr tablet Take 50 mg by mouth daily.     [provider]  nitroGLYCERIN (NITROSTAT) 0.4 MG SL tablet Place 0.4 mg under the tongue every 5 (five) minutes as needed for chest pain.    [provider]  Omega-3 Fatty Acids (FISH OIL PO) Take 1 Dose by mouth daily.    [provider]  TURMERIC PO Take 1 Dose by mouth daily.    [provider]  UNABLE TO FIND Take 1 Dose by mouth daily. Med Name: Pure oil, OTC    [provider]    Family History Family History  Problem Relation Age of Onset  . Cancer Father   . Heart attack Brother   . Heart attack Brother     Social History Social History  Substance Use Topics  . Smoking status: Never Smoker  . Smokeless tobacco: Never Used  . Alcohol use No     Allergies   Ibuprofen; Doxycycline; and Oxycodone   Review of Systems Review of Systems ROS: Statement: All systems negative except as marked or noted in the HPI; Constitutional: Negative for fever and chills. +generalized weakness/fatigue.; ; Eyes: Negative for eye pain, redness and discharge. ; ; ENMT: Negative for ear pain, hoarseness, nasal congestion,  sinus pressure and sore throat. ; ; Cardiovascular: Negative for chest pain, palpitations, diaphoresis, dyspnea and peripheral edema. ; ; Respiratory: Negative for cough, wheezing and stridor. ; ; Gastrointestinal: Negative for nausea, vomiting, diarrhea, abdominal pain, blood in stool, hematemesis, jaundice and rectal bleeding. . ; ; Genitourinary: Negative for dysuria, flank pain and hematuria. ; ; Musculoskeletal: Negative for back pain and neck pain. Negative for swelling and trauma.; ; Skin: +"burning sensation internally." Negative for pruritus, rash, abrasions, blisters, bruising and skin lesion.; ; Neuro: Negative for headache, lightheadedness and neck stiffness. Negative for altered level of consciousness, altered mental status, extremity weakness, paresthesias, involuntary movement, seizure and syncope.       Physical Exam Updated Vital Signs BP (!) 176/67 (BP Location: Left Arm)   Pulse (!) 55   Temp (!) 97.4 F (36.3 C) (Temporal)   Resp 18   Ht 5\' 5"  (1.651 m)   Wt 59 kg (130 lb)   SpO2 99%   BMI 21.63 kg/m   19:02 Orthostatic Vital Signs LN  Orthostatic Lying   BP- Lying: 186/66  Pulse- Lying:  49      Orthostatic Sitting  BP- Sitting: 196/77  Pulse- Sitting: 54      Orthostatic Standing at 0 minutes  BP- Standing at 0 minutes: 188/82  Pulse- Standing at 0 minutes: 65      Physical Exam 1720: Physical examination:  Nursing notes reviewed; Vital signs and O2 SAT reviewed;  Constitutional: Well developed, Well nourished, Well hydrated, In no acute distress; Head:  Normocephalic, atraumatic; Eyes: EOMI, PERRL, No scleral icterus; ENMT: Mouth and pharynx normal, Mucous membranes moist; Neck: Supple, Full range of motion, No lymphadenopathy; Cardiovascular: Regular rate and rhythm, No gallop; Respiratory: Breath sounds clear & equal bilaterally, No wheezes.  Speaking full sentences with ease, Normal respiratory effort/excursion; Chest: Nontender, Movement normal;  Abdomen: Soft, Nontender, Nondistended, Normal bowel sounds; Genitourinary: No CVA tenderness; Extremities: Pulses normal, No tenderness, No edema, No calf edema or asymmetry.; Neuro: AA&Ox3, Major CN grossly intact. No facial droop. Speech clear. No gross focal motor or sensory deficits in extremities.; Skin: Color normal, Warm, Dry.; Psych:  Anxious.    ED Treatments / Results  Labs (all labs ordered are listed, but only abnormal results are displayed)   EKG  EKG Interpretation  Date/Time:  Sunday April 04 2017 18:14:23 EDT Ventricular Rate:  47 PR Interval:    QRS Duration: 101 QT Interval:  467 QTC Calculation: 413 R Axis:   60 Text Interpretation:  Sinus bradycardia Borderline prolonged PR interval Baseline wander When compared with ECG of 03/28/2017 Sinus bradycardia has replaced Atrial fibrillation Confirmed by [facility]  MD, Nunzio Cory [phone]) on 04/04/2017 6:33:33 PM  Radiology   Procedures Procedures (including critical care time)  Medications Ordered in ED Medications - No data to display   Initial Impression / Assessment and Plan / ED Course  I have reviewed the triage vital signs and the nursing notes.  Pertinent labs & imaging results that were available during my care of the patient were reviewed by me and considered in my medical decision making (see chart for details).  MDM Reviewed: previous chart, nursing note and vitals Reviewed previous: labs and ECG Interpretation: labs, ECG, x-ray and CT scan Total time providing critical care: 30-74 minutes. This excludes time spent performing separately reportable procedures and services. Consults: admitting MD   CRITICAL CARE Performed by: Alfonzo Feller Total critical care time: 35 minutes Critical care time was exclusive of separately billable procedures and treating other patients. Critical care was necessary to treat or prevent imminent or life-threatening deterioration. Critical care was time spent  personally by me on the following activities: development of treatment plan with patient and/or surrogate as well as nursing, discussions with consultants, evaluation of patient's response to treatment, examination of patient, obtaining history from patient or surrogate, ordering and performing treatments and interventions, ordering and review of laboratory studies, ordering and review of radiographic studies, pulse oximetry and re-evaluation of patient's condition.  Results for orders placed or performed during the hospital encounter of 04/04/17  Urinalysis, Routine w reflex microscopic  Result Value Ref Range   Color, Urine STRAW (A) YELLOW   APPearance CLEAR CLEAR   Specific Gravity, Urine 1.004 (L) 1.005 - 1.030   pH 7.0 5.0 - 8.0   Glucose, UA NEGATIVE NEGATIVE mg/dL   Hgb urine dipstick NEGATIVE NEGATIVE   Bilirubin Urine NEGATIVE NEGATIVE   Ketones, ur NEGATIVE NEGATIVE mg/dL   Protein, ur NEGATIVE NEGATIVE mg/dL   Nitrite NEGATIVE NEGATIVE   Leukocytes, UA NEGATIVE NEGATIVE  Comprehensive metabolic panel  Result Value Ref Range   Sodium 128 (L) 135 - 145 mmol/L   Potassium 4.2 3.5 - 5.1 mmol/L   Chloride 91 (L) 101 - 111 mmol/L   CO2 28 22 - 32 mmol/L   Glucose, Bld 105 (H) 65 - 99 mg/dL   BUN 16 6 - 20 mg/dL   Creatinine, Ser 0.83 0.44 - 1.00 mg/dL   Calcium 9.3 8.9 - 10.3 mg/dL   Total Protein 7.1 6.5 - 8.1 g/dL   Albumin 3.8 3.5 - 5.0 g/dL   AST 19 15 - 41 U/L   ALT 16 14 - 54 U/L   Alkaline Phosphatase 57 38 - 126 U/L   Total Bilirubin 0.6 0.3 - 1.2 mg/dL   GFR calc non Af Amer >60 >60 mL/min   GFR calc Af Amer >60 >60 mL/min   Anion gap 9 5 - 15  Troponin I  Result Value Ref Range   Troponin I <0.03 <0.03 ng/mL  Lactic acid, plasma  Result Value Ref Range   Lactic Acid, Venous 1.4 0.5 - 1.9 mmol/L  CBC with Differential  Result Value Ref Range   WBC 4.9 4.0 - 10.5 K/uL   RBC 4.58 3.87 - 5.11 MIL/uL   Hemoglobin 13.7 12.0 - 15.0 g/dL   HCT 39.2 36.0 - 46.0 %     MCV 85.6 78.0 - 100.0 fL   MCH 29.9 26.0 - 34.0 pg   MCHC 34.9 30.0 - 36.0 g/dL   RDW 12.8 11.5 - 15.5 %   Platelets 183 150 - 400 K/uL   Neutrophils Relative % 58 %  Neutro Abs 2.9 1.7 - 7.7 K/uL   Lymphocytes Relative 30 %   Lymphs Abs 1.5 0.7 - 4.0 K/uL   Monocytes Relative 8 %   Monocytes Absolute 0.4 0.1 - 1.0 K/uL   Eosinophils Relative 3 %   Eosinophils Absolute 0.1 0.0 - 0.7 K/uL   Basophils Relative 1 %   Basophils Absolute 0.0 0.0 - 0.1 K/uL  Magnesium  Result Value Ref Range   Magnesium 2.2 1.7 - 2.4 mg/dL   Dg Chest 2 View Result Date: 04/04/2017 CLINICAL DATA:  Weakness.  Burning sensation. EXAM: CHEST  2 VIEW COMPARISON:  March 28, 2017 FINDINGS: The heart monitor projects over the left lung. No pneumothorax. The cardiomediastinal silhouette is normal. No pulmonary nodules, masses, or focal infiltrates. IMPRESSION: No active cardiopulmonary disease. Electronically Signed   By: Dorise Bullion III M.D   On: 04/04/2017 18:15    Ct Head Wo Contrast Result Date: 04/04/2017 CLINICAL DATA:  Pt reports she has been having a burning sensation "internally all over" for two months since her stroke with fluctuating BP. Also states vaginal burning with scant urination. Denies burning with urination or blood in urine. Pt stage she has been eating and drinking normally. EXAM: CT HEAD WITHOUT CONTRAST TECHNIQUE: Contiguous axial images were obtained from the base of the skull through the vertex without intravenous contrast. COMPARISON:  CT head 02/01/2017.  Intracranial MRA 02/02/2017. FINDINGS: Brain: There is no evidence of acute intracranial hemorrhage, mass lesion, brain edema or extra-axial fluid collection. The ventricles and subarachnoid spaces are appropriately sized for age. There is no CT evidence of acute cortical infarction. Vascular: Mild hyperdensity in the region of the right cavernous ICA, similar to previous studies. Skull: Negative for fracture or focal lesion.  Sinuses/Orbits: Circumferential right maxillary sinus mucosal thickening, stable. The visualized paranasal sinuses and mastoid air cells are otherwise clear. No orbital abnormalities are seen. Other: None. IMPRESSION: 1. Stable examination.  No acute intracranial findings. 2. Stable right maxillary sinus mucosal thickening. Electronically Signed   By: Richardean Sale M.D.   On: 04/04/2017 18:02     1725:  Pt has multiple somatic complaints today and she, and her family, are requesting very specialized testing (ie: "renal stenosis testing") to tell them exactly what is wrong with her "because no one else can." ED role in healthcare continuum explained, as well as dx testing available to ED; expectations for ED visit set with pt and family. Both verb understanding. Workup ordered.   2000:  Pt is not orthostatic on VS. Pt has ambulated with steady gait, Sats 100% R/A, NAD. APAP given for "pains all over." Pt has tol PO well without N/V. Hyponatremia similar to last week's ED labs. Will dose judicious IV fluids. Pt and family concerned regarding discharge home.  T/C to [facility] Dr. Darrick Meigs, case discussed, including:  HPI, pertinent PM/SHx, VS/PE, dx testing, ED course and treatment:  Agreeable to admit.  2040:  [facility] Dr. Darrick Meigs has evaluated pt (see consult note):  Pt now does not want to be admitted and wants to go home now, hyponatremia likely due to HCTZ that was started 2 weeks ago (and stopped 3 days ago by PMD), other symptoms likely polypharmacy as pt has been started on multiple new medications over the past few weeks, requests EDP to rx xanax 0.5mg  qhs only, and pt should f/u with PMD this week. Pt and family are agreeable with this new plan. Will d/c stable.     Final Clinical Impressions(s) /  ED Diagnoses   Final diagnoses:  None    New Prescriptions New Prescriptions   No medications on file     Francine Graven, DO 04/08/17 [id number]

## 2017-04-04 NOTE — Discharge Instructions (Signed)
Take the prescription as directed. Otherwise take your usual prescriptions as previously directed.  Take your blood pressure only once per day, either in the morning approximately 1 hour after you take your medicine(s) or in the evening before you go to bed.  Always sit quietly for at least 15 minutes before taking your blood pressure.  Keep a diary of your blood pressures to show your doctor at your follow up office visit.  Call your regular medical doctor tomorrow morning to schedule a follow up appointment this week to re-check your labs and possibly adjust your medications.  Return to the Emergency Department immediately sooner if worsening.

## 2017-04-04 NOTE — ED Notes (Signed)
Ambulated patient with pulse oximetry on oxygen stayed at 100% hold time.

## 2017-04-06 LAB — URINE CULTURE: Culture: NO GROWTH

## 2017-04-07 DIAGNOSIS — R3912 Poor urinary stream: Secondary | ICD-10-CM | POA: Diagnosis not present

## 2017-04-07 DIAGNOSIS — I10 Essential (primary) hypertension: Secondary | ICD-10-CM | POA: Diagnosis not present

## 2017-04-07 DIAGNOSIS — Z6823 Body mass index (BMI) 23.0-23.9, adult: Secondary | ICD-10-CM | POA: Diagnosis not present

## 2017-04-13 DIAGNOSIS — I10 Essential (primary) hypertension: Secondary | ICD-10-CM | POA: Diagnosis not present

## 2017-04-13 DIAGNOSIS — R002 Palpitations: Secondary | ICD-10-CM | POA: Diagnosis not present

## 2017-04-13 DIAGNOSIS — I639 Cerebral infarction, unspecified: Secondary | ICD-10-CM | POA: Diagnosis not present

## 2017-04-13 DIAGNOSIS — R8299 Other abnormal findings in urine: Secondary | ICD-10-CM | POA: Diagnosis not present

## 2017-04-13 DIAGNOSIS — I499 Cardiac arrhythmia, unspecified: Secondary | ICD-10-CM | POA: Diagnosis not present

## 2017-04-13 DIAGNOSIS — Z6823 Body mass index (BMI) 23.0-23.9, adult: Secondary | ICD-10-CM | POA: Diagnosis not present

## 2017-04-13 DIAGNOSIS — F419 Anxiety disorder, unspecified: Secondary | ICD-10-CM | POA: Diagnosis not present

## 2017-04-15 DIAGNOSIS — I4891 Unspecified atrial fibrillation: Secondary | ICD-10-CM | POA: Diagnosis not present

## 2017-04-20 DIAGNOSIS — I6930 Unspecified sequelae of cerebral infarction: Secondary | ICD-10-CM | POA: Diagnosis not present

## 2017-04-22 DIAGNOSIS — G629 Polyneuropathy, unspecified: Secondary | ICD-10-CM | POA: Diagnosis not present

## 2017-04-22 DIAGNOSIS — I10 Essential (primary) hypertension: Secondary | ICD-10-CM | POA: Diagnosis not present

## 2017-04-22 DIAGNOSIS — R7301 Impaired fasting glucose: Secondary | ICD-10-CM | POA: Diagnosis not present

## 2017-04-22 DIAGNOSIS — G4700 Insomnia, unspecified: Secondary | ICD-10-CM | POA: Diagnosis not present

## 2017-04-22 DIAGNOSIS — R197 Diarrhea, unspecified: Secondary | ICD-10-CM | POA: Diagnosis not present

## 2017-04-22 DIAGNOSIS — F419 Anxiety disorder, unspecified: Secondary | ICD-10-CM | POA: Diagnosis not present

## 2017-04-22 DIAGNOSIS — I499 Cardiac arrhythmia, unspecified: Secondary | ICD-10-CM | POA: Diagnosis not present

## 2017-04-22 DIAGNOSIS — Z6823 Body mass index (BMI) 23.0-23.9, adult: Secondary | ICD-10-CM | POA: Diagnosis not present

## 2017-05-14 DIAGNOSIS — I482 Chronic atrial fibrillation: Secondary | ICD-10-CM | POA: Diagnosis not present

## 2017-05-14 DIAGNOSIS — Z6823 Body mass index (BMI) 23.0-23.9, adult: Secondary | ICD-10-CM | POA: Diagnosis not present

## 2017-05-14 DIAGNOSIS — R7301 Impaired fasting glucose: Secondary | ICD-10-CM | POA: Diagnosis not present

## 2017-05-14 DIAGNOSIS — I10 Essential (primary) hypertension: Secondary | ICD-10-CM | POA: Diagnosis not present

## 2018-07-02 IMAGING — DX DG CHEST 2V
2 series · 2 of 2 positions shown · non-contrast
Comparison: None

CLINICAL DATA: Hypotension, near syncope, history of atrial
fibrillation, stroke, melanoma

EXAM:
CHEST  2 VIEW

[chest lat]
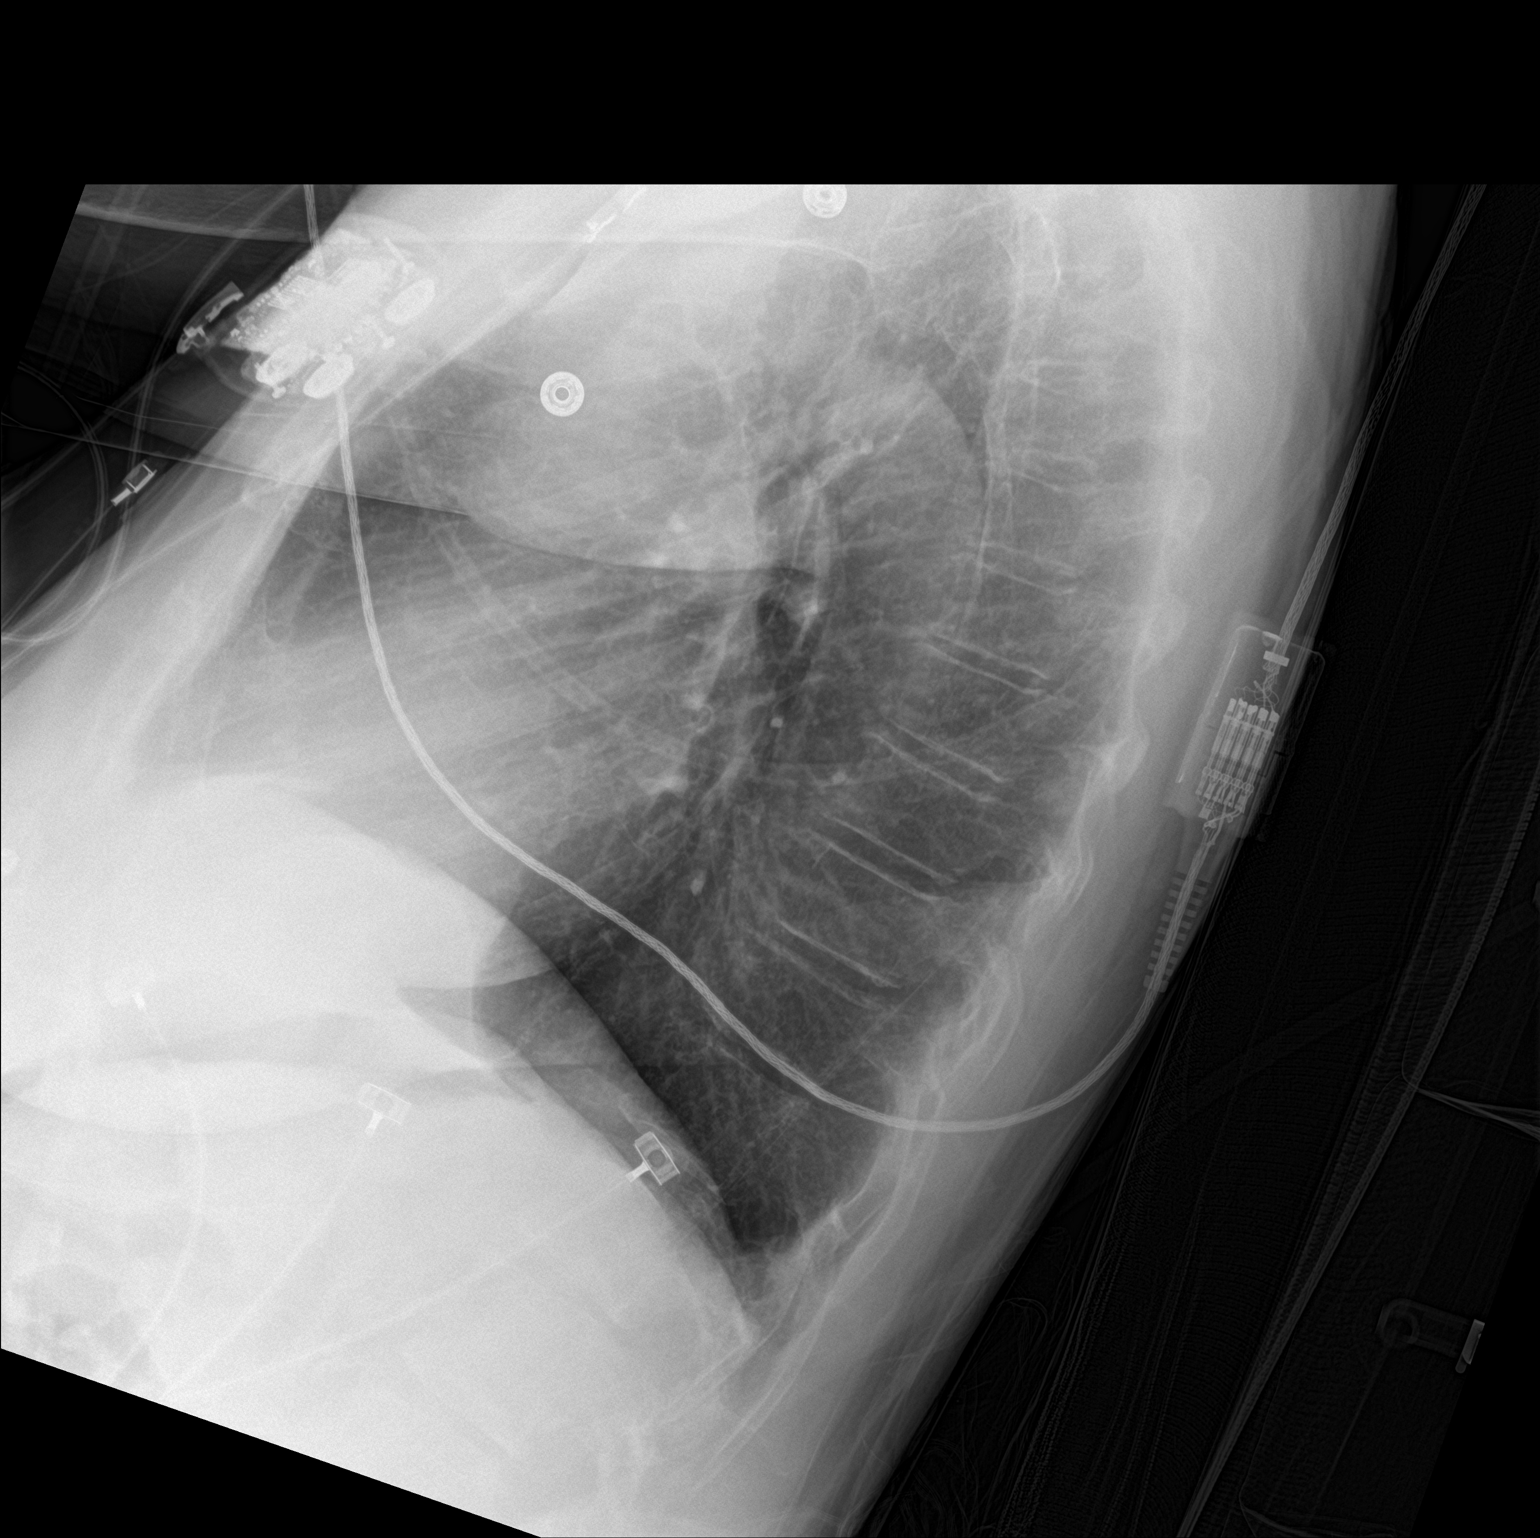

[chest ap]
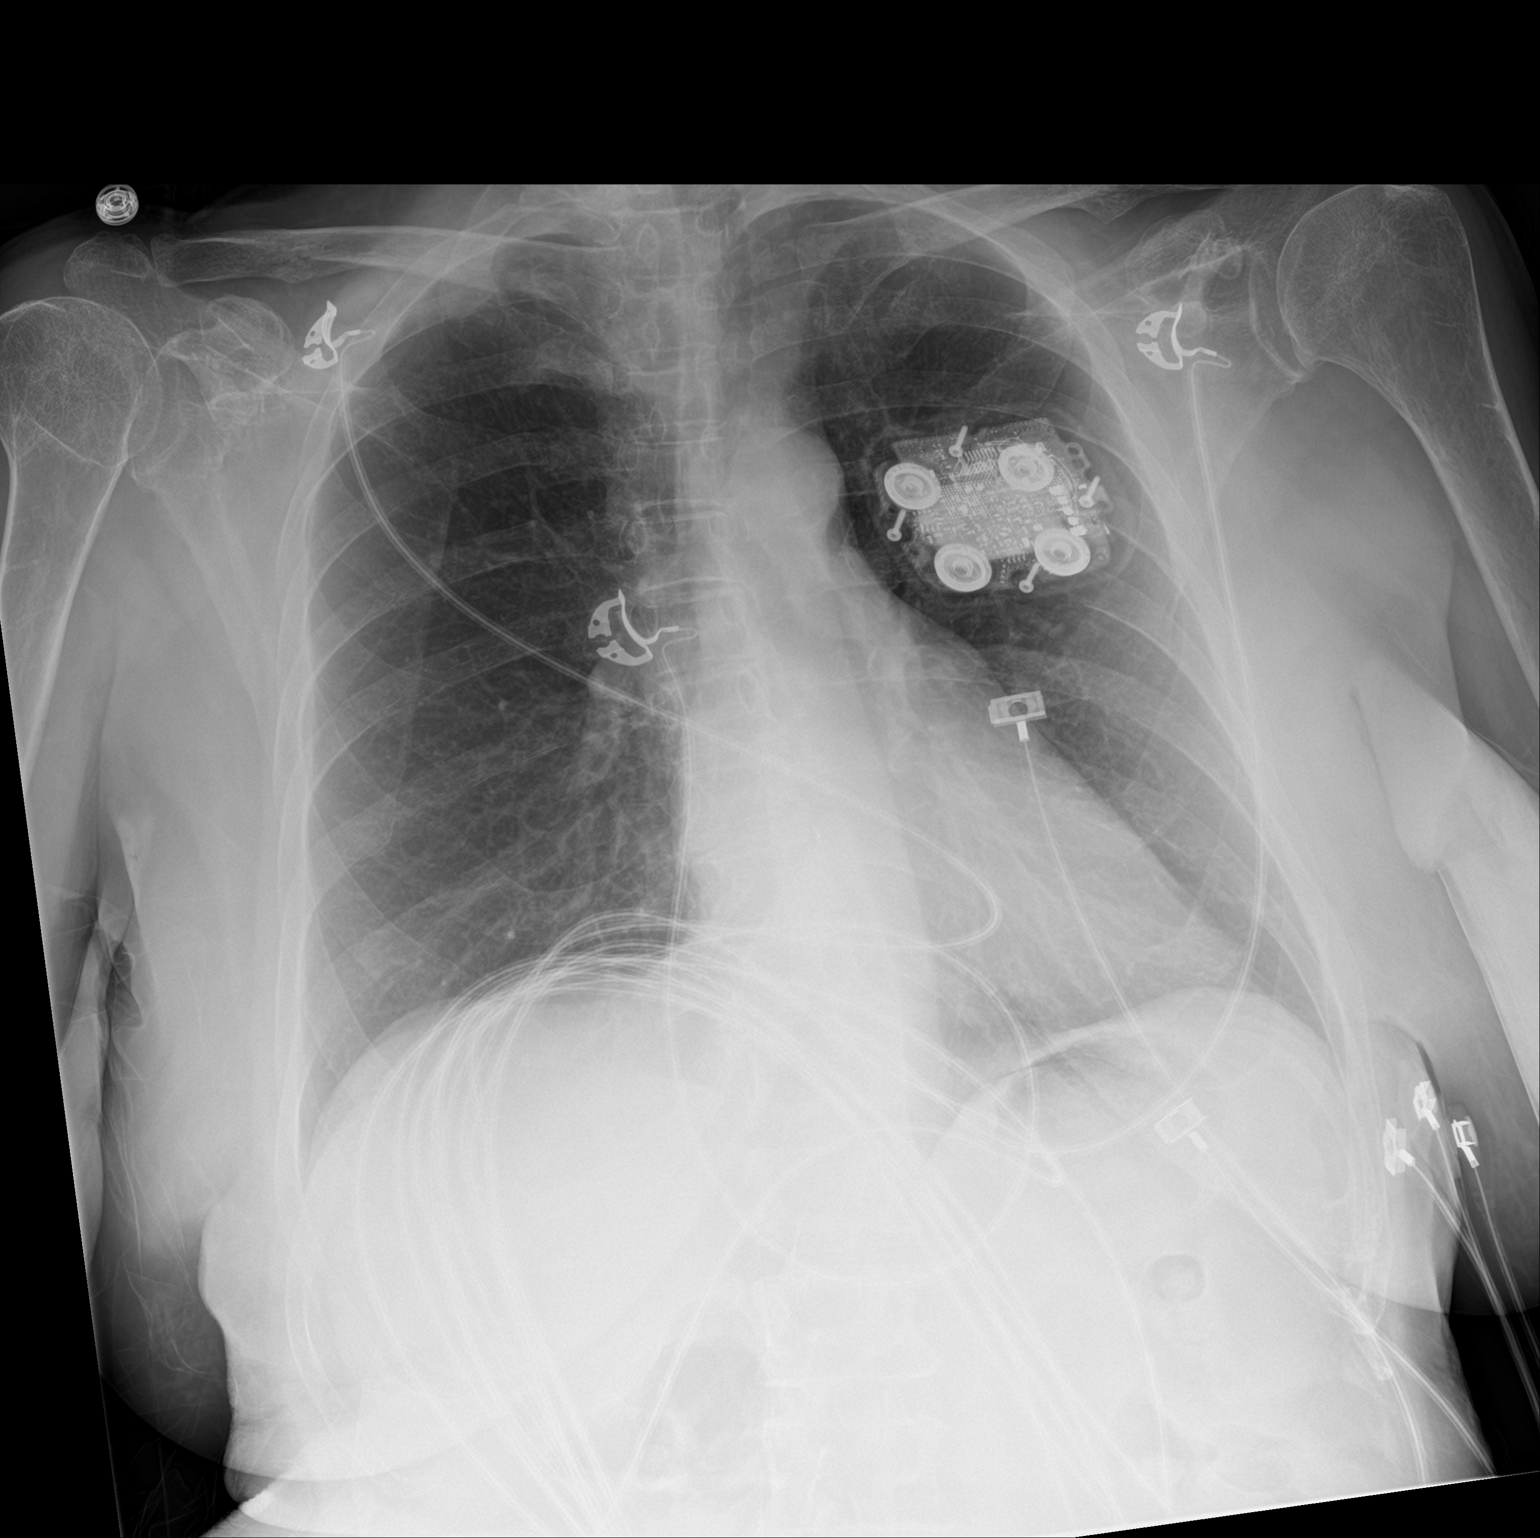

[2 of 2 positions shown; findings below may reference images not displayed]

FINDINGS: Reported heart monitor projects over LEFT lung.

Normal heart size, mediastinal contours, and pulmonary vascularity.

Lungs minimally hyperinflated but clear.

No pulmonary infiltrate, pleural effusion or pneumothorax.

Bones demineralized.
IMPRESSION: No acute abnormalities.

## 2018-07-09 IMAGING — CT CT HEAD W/O CM
3 series · 15 of 47 positions shown, 18 images · non-contrast
Comparison: CT head 02/01/2017.  Intracranial MRA 02/02/2017.

CLINICAL DATA: Pt reports she has been having a burning sensation
"internally all over" for two months since her stroke with
fluctuating BP. Also states vaginal burning with scant urination.
Denies burning with urination or blood in urine. Pt stage she has
been eating and drinking normally.

EXAM:
CT HEAD WITHOUT CONTRAST
TECHNIQUE: Contiguous axial images were obtained from the base of the skull
through the vertex without intravenous contrast.

[Series 2: head wo · axial · 0.41mm/px · z∈[+5,+135]mm · 9 of 32 slices shown, 12 images]
[im 3/32  brain]
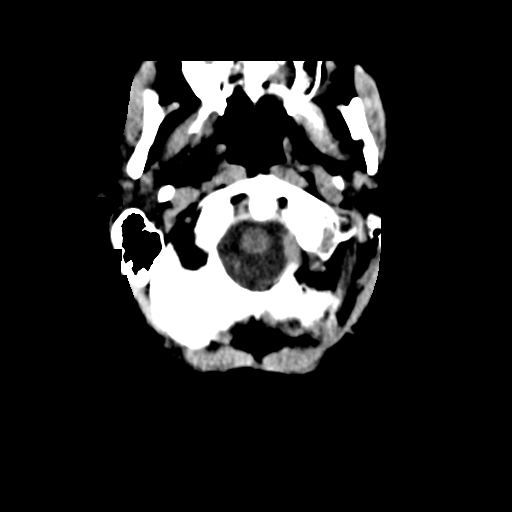
[im 3/32  bone]
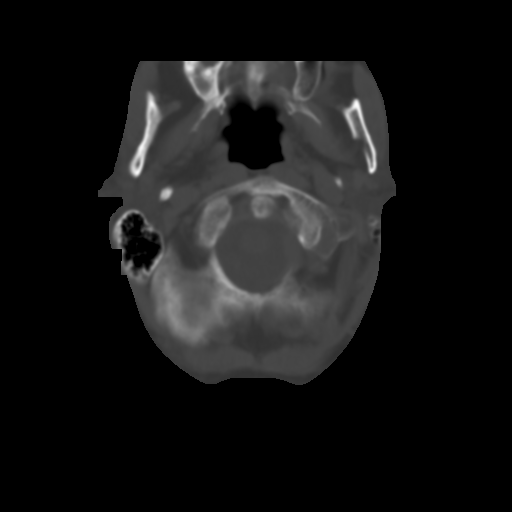
[im 6/32  brain]
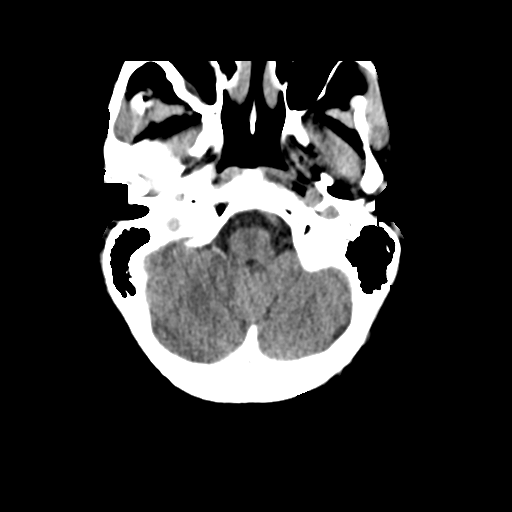
[im 9/32  brain]
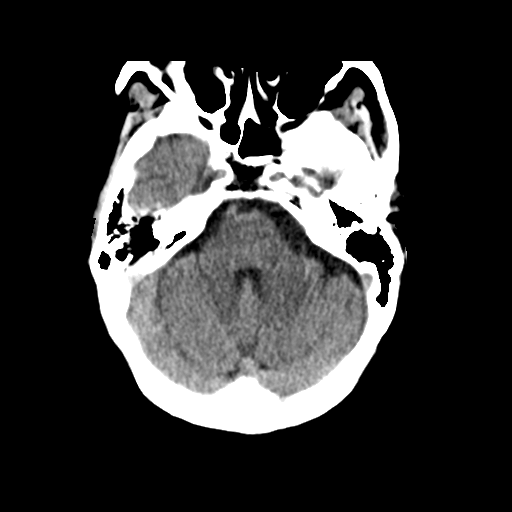
[im 12/32  brain]
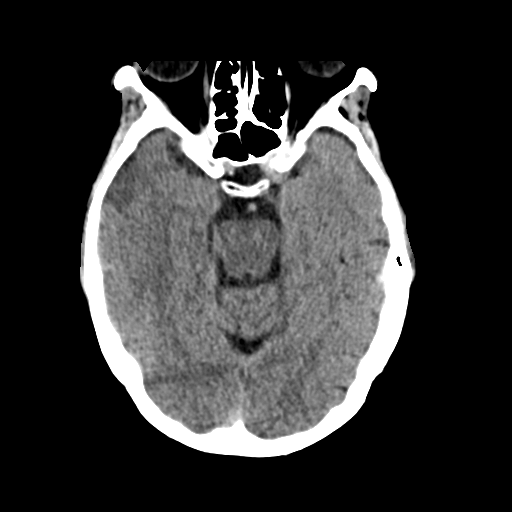
[im 17/32  brain]
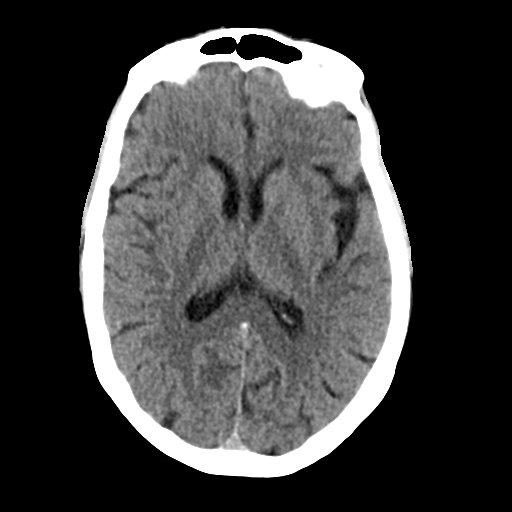
[im 17/32  bone]
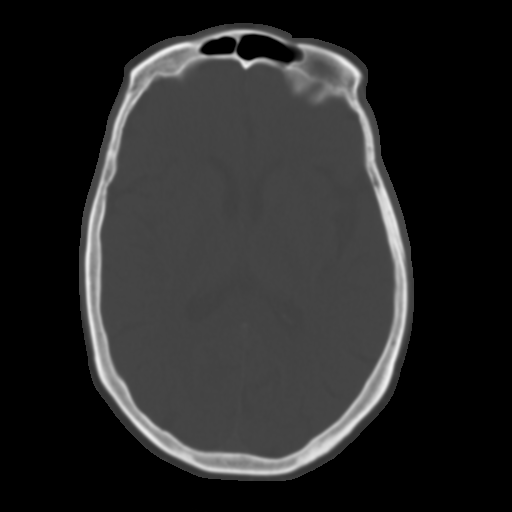
[im 20/32  brain]
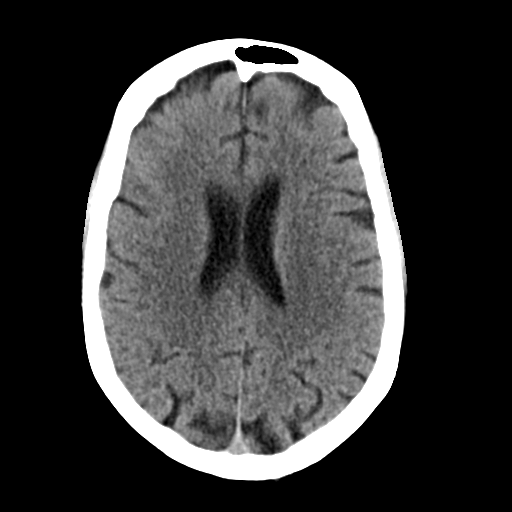
[im 23/32  brain]
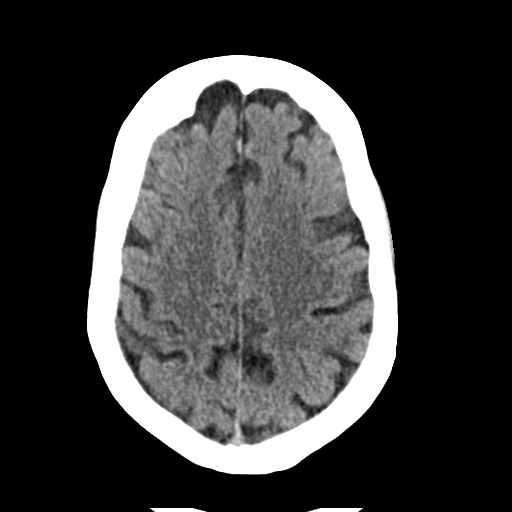
[im 26/32  brain]
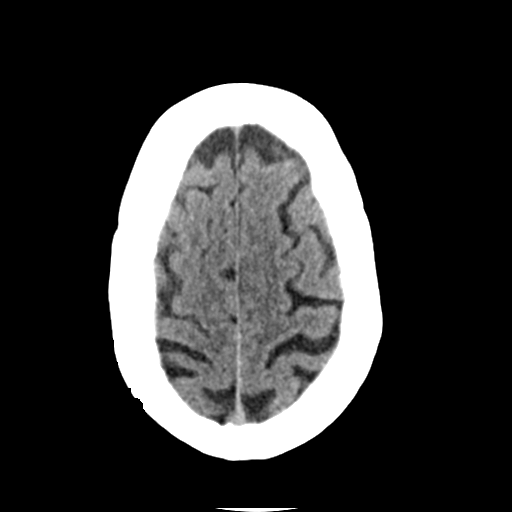
[im 29/32  brain]
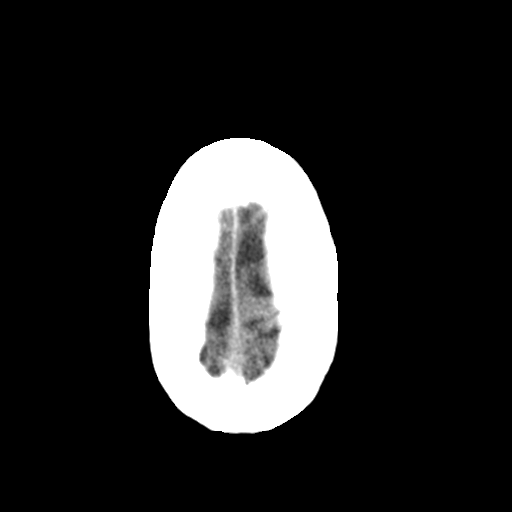
[im 29/32  bone]
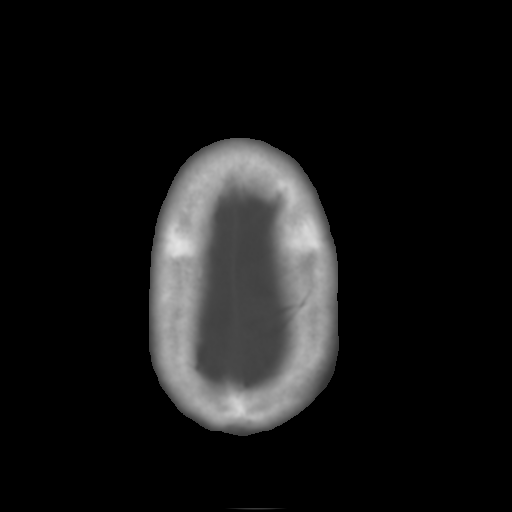

[Series 4: coronal soft tissue · coronal · 0.31mm/px · 3 of 65 slices shown]
[im 22/65  brain]
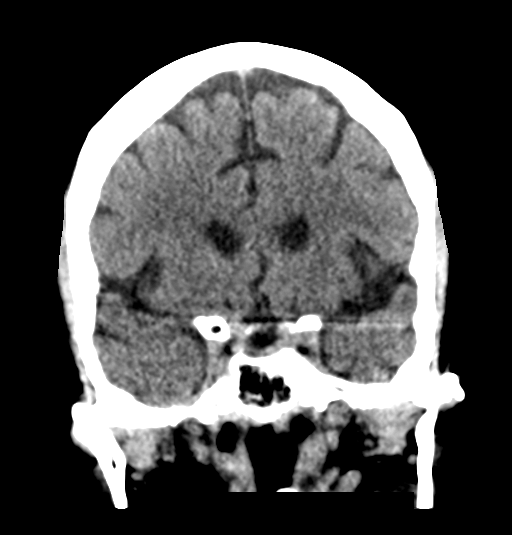
[im 29/65  brain]
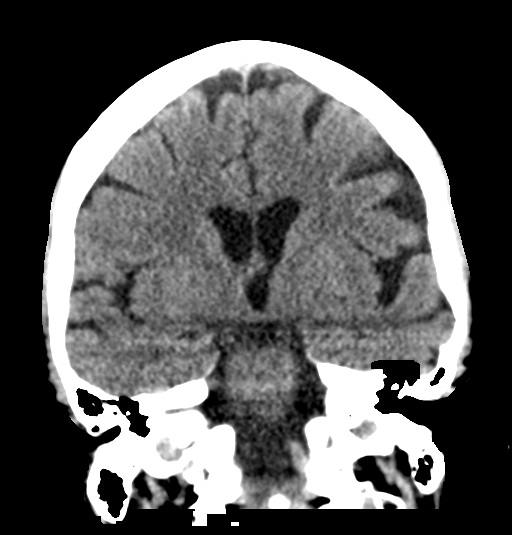
[im 36/65  brain]
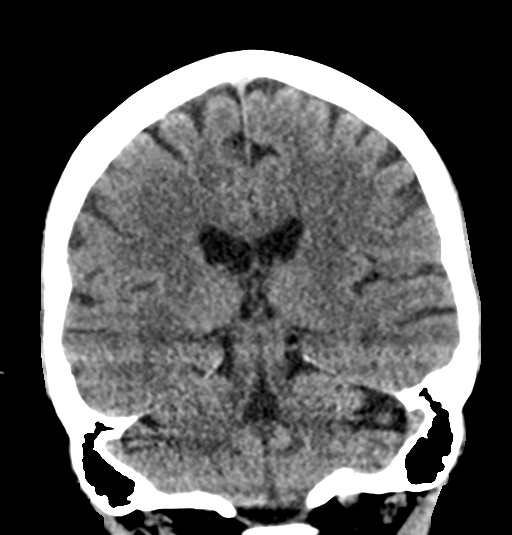

[Series 5: sagittal soft tissue · sagittal · 0.35mm/px · 3 of 47 slices shown]
[im 16/47  brain]
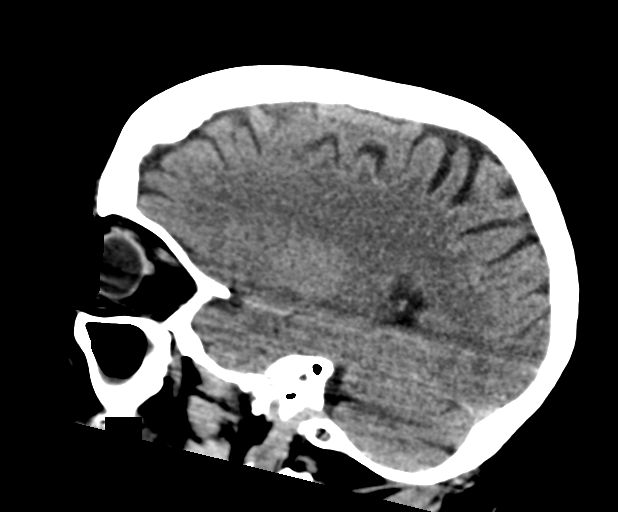
[im 24/47  brain]
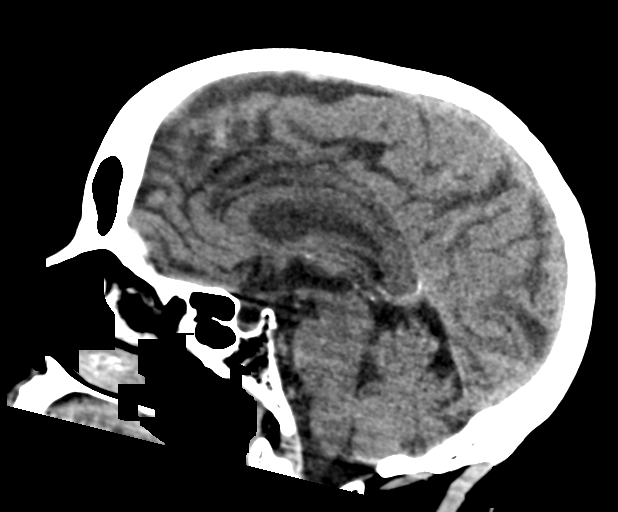
[im 31/47  brain]
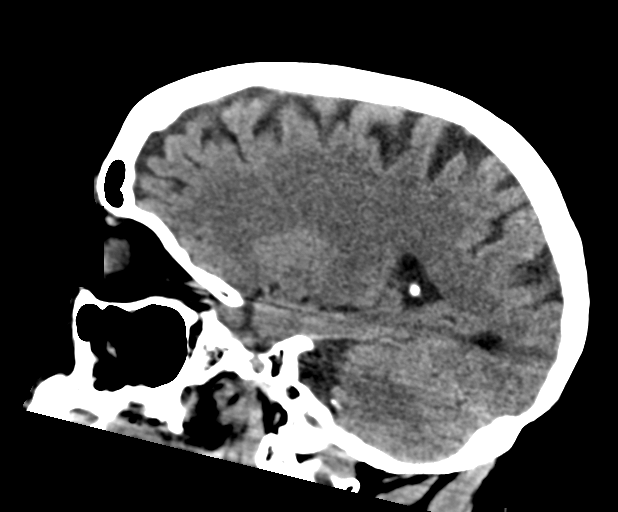

[15 of 47 positions shown; findings below may reference images not displayed]

FINDINGS: Brain: There is no evidence of acute intracranial hemorrhage, mass
lesion, brain edema or extra-axial fluid collection. The ventricles
and subarachnoid spaces are appropriately sized for age. There is no
CT evidence of acute cortical infarction.

Vascular: Mild hyperdensity in the region of the right cavernous
ICA, similar to previous studies.

Skull: Negative for fracture or focal lesion.

Sinuses/Orbits: Circumferential right maxillary sinus mucosal
thickening, stable. The visualized paranasal sinuses and mastoid air
cells are otherwise clear. No orbital abnormalities are seen.

Other: None.
IMPRESSION: 1. Stable examination.  No acute intracranial findings.
2. Stable right maxillary sinus mucosal thickening.
